# Patient Record
Sex: Male | Born: 1980 | Race: Black or African American | Hispanic: No | Marital: Single | Smoking: Never smoker
Health system: Southern US, Community
[De-identification: ages and names within clinical notes are randomized; demographics above are authoritative.]

---

## 2006-11-12 ENCOUNTER — Emergency Department: Payer: Self-pay | Admitting: Emergency Medicine

## 2007-08-15 ENCOUNTER — Emergency Department: Payer: Self-pay | Admitting: Emergency Medicine

## 2007-08-15 ENCOUNTER — Other Ambulatory Visit: Payer: Self-pay

## 2010-06-21 ENCOUNTER — Emergency Department: Payer: Self-pay | Admitting: Emergency Medicine

## 2011-04-24 ENCOUNTER — Emergency Department: Payer: Self-pay | Admitting: Emergency Medicine

## 2012-03-26 ENCOUNTER — Emergency Department: Payer: Self-pay | Admitting: Emergency Medicine

## 2013-06-24 IMAGING — CR RIGHT GREAT TOE
1 series · 3 of 3 positions shown · non-contrast
Comparison: none

REASON FOR EXAM: pain after basketball, eval for fx
COMMENTS:

[Series 1: ap · 0.17mm/px · 3 of 3 slices shown]
[im 1/3]
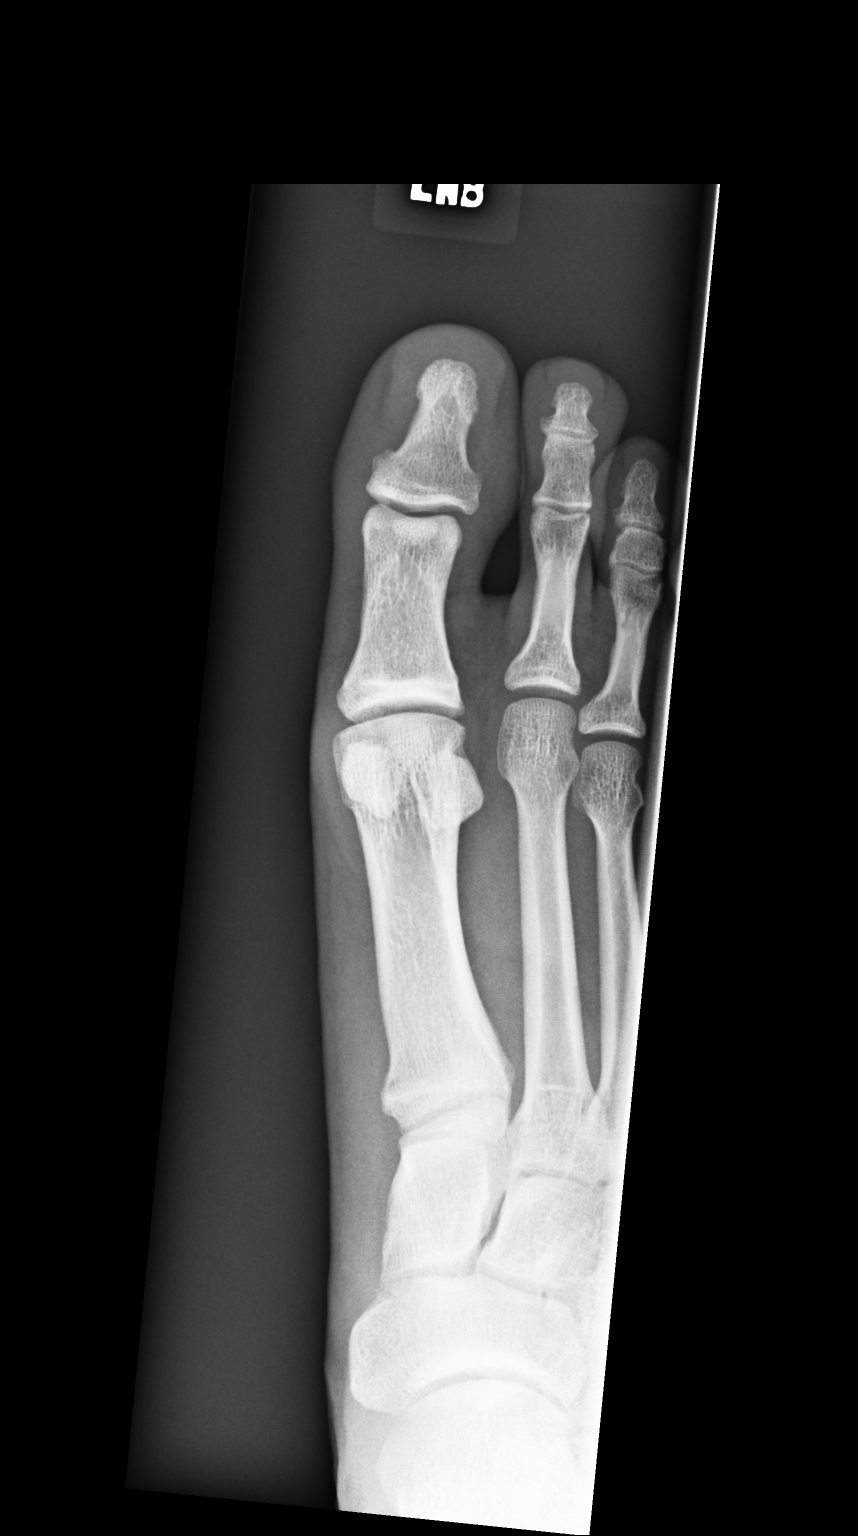
[im 2/3]
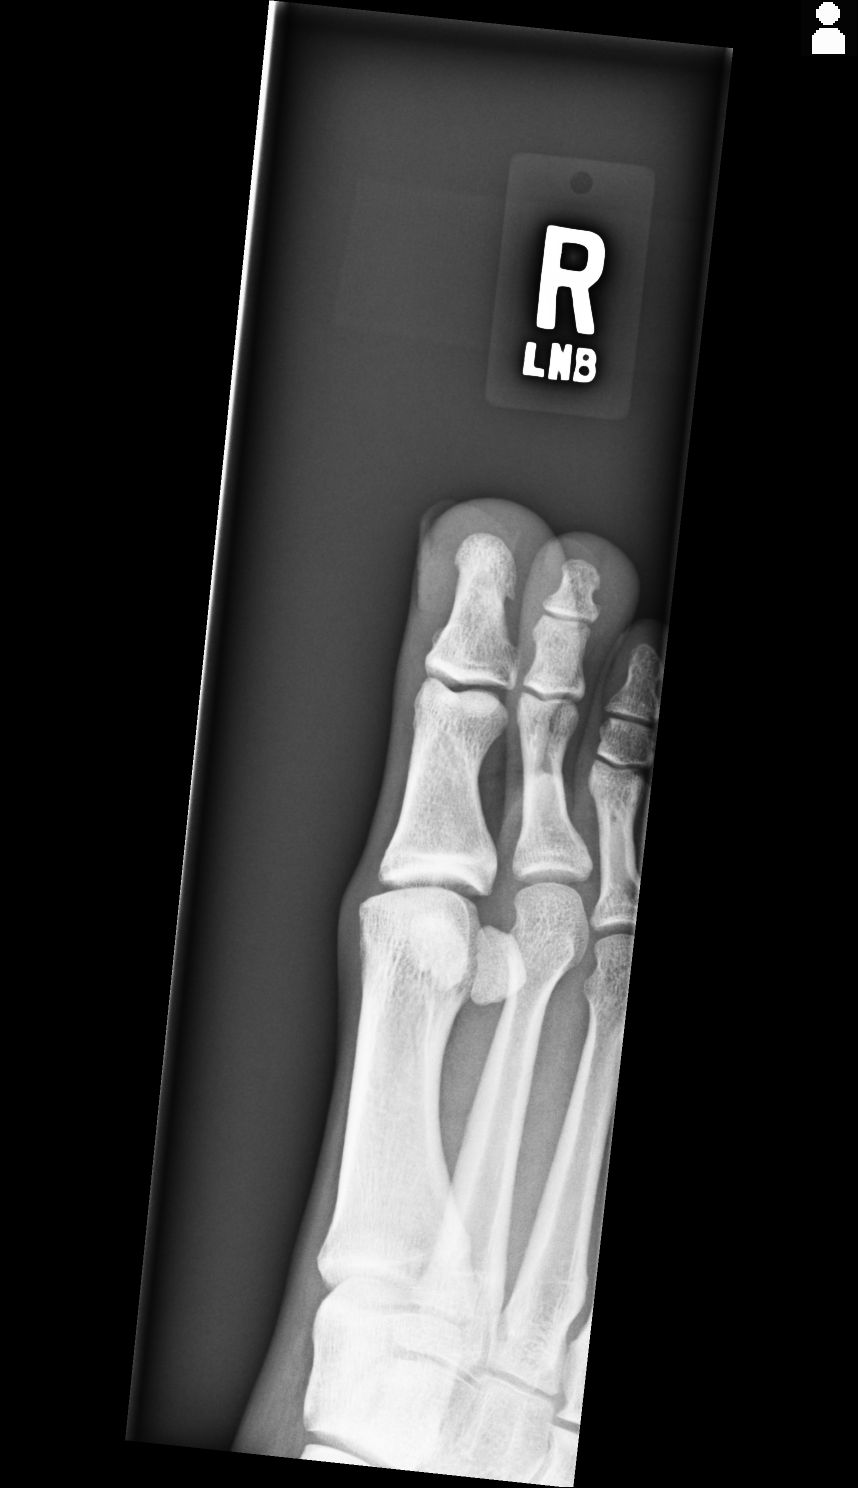
[im 3/3]
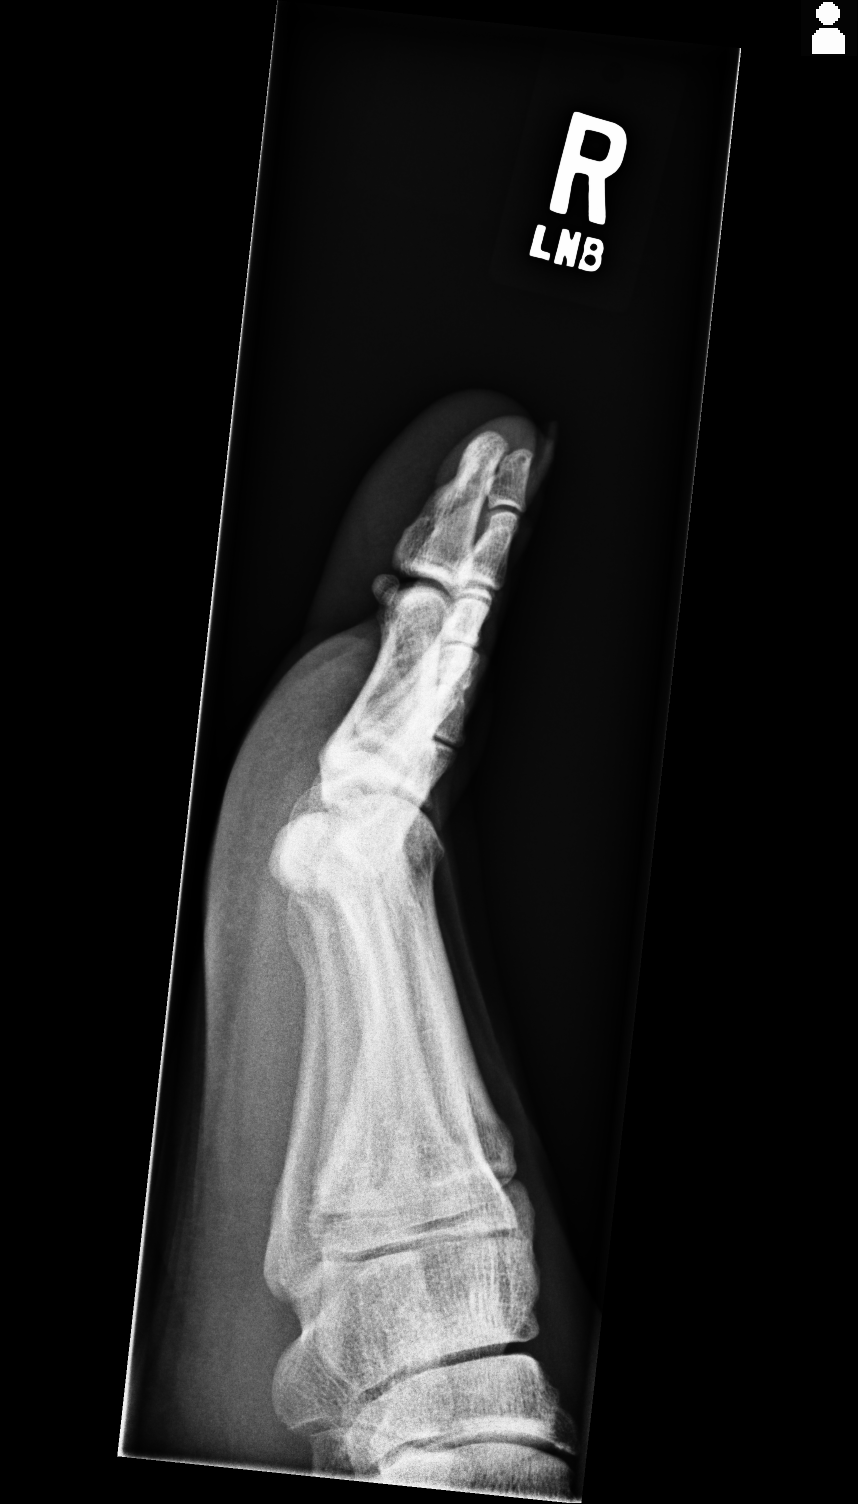

[3 of 3 positions shown; findings below may reference images not displayed]

PROCEDURE:     DXR - DXR TOE GREAT (1ST DIGIT) RT BONNELL  - March 26, 2012  [DATE]

RESULT:     Three views of the right great toe are reviewed. The bones are
adequately mineralized. There is no evidence of a lytic or blastic lesion.
There is no acute fracture demonstrated. The overlying soft tissues are
normal in appearance.
IMPRESSION: There is no acute bony abnormality of the right great toe.

[REDACTED]

## 2016-05-07 ENCOUNTER — Emergency Department: Payer: Self-pay

## 2016-05-07 ENCOUNTER — Emergency Department
Admission: EM | Admit: 2016-05-07 | Discharge: 2016-05-07 | Disposition: A | Discharge status: Home / Self Care | Payer: Self-pay | Attending: Emergency Medicine | Admitting: Emergency Medicine

## 2016-05-07 DIAGNOSIS — R52 Pain, unspecified: Secondary | ICD-10-CM

## 2016-05-07 DIAGNOSIS — M940 Chondrocostal junction syndrome [Tietze]: Secondary | ICD-10-CM | POA: Insufficient documentation

## 2016-05-07 MED ORDER — KETOROLAC TROMETHAMINE 30 MG/ML IJ SOLN
30.0000 mg | Freq: Once | INTRAMUSCULAR | Status: AC
  Administered 2016-05-07: 30 mg via INTRAMUSCULAR
  Filled 2016-05-07: qty 1

## 2016-05-07 MED ORDER — KETOROLAC TROMETHAMINE 30 MG/ML IJ SOLN: 30.0000 mg | Freq: Once | INTRAMUSCULAR | Status: DC

## 2016-05-07 MED ORDER — NAPROXEN 500 MG PO TBEC: 500.0000 mg | DELAYED_RELEASE_TABLET | Freq: Two times a day (BID) | ORAL | 0 refills | Status: AC

## 2016-05-07 NOTE — ED Provider Notes (Signed)
[facility] Emergency Department Provider Note  ____________________________________________  Time seen: Approximately 7:55 PM  I have reviewed the triage vital signs and the nursing notes.   HISTORY  Chief Complaint "chest wall pain"     HPI Philip Rivera is a 36 y.o. male presenting to the emergency department with reproducible right, sharp 6/10 costochondral pain, 9-10 th that has occurred for the past three days. Costochondral pain is worsened with deep inspiration.  Patient denies having cough. Triage note noted. Patient denies trauma to the anterior chest wall. 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 He denies congestion, rhinorrhea, pharyngitis, recreational drug use or recent illness. He has been afebrile. Patient denies a history of hypertension, cardiac pathology or anxiety. He has not attempted alleviating measures. Patient denies major adult illnesses. Patient takes no medications chronically.   History reviewed. No pertinent past medical history.  There are no active problems to display for this patient.   History reviewed. No pertinent surgical history.  Prior to Admission medications   Medication Sig Start Date End Date Taking? Authorizing Provider  naproxen (EC NAPROSYN) 500 MG EC tablet Take 1 tablet (500 mg total) by mouth 2 (two) times daily with a meal. 05/07/16 05/21/16  Orvil Feil, PA-C    Allergies Patient has no known allergies.  No family history on file.  Social History Social History  Substance Use Topics  . Smoking status: Not on file  . Smokeless tobacco: Never Used  . Alcohol use No     Review of Systems  Constitutional: No fever/chills ENT: No upper respiratory complaints. Cardiovascular: Patient has reproducible right, sharp 6/10 costochondral pain, 9-10th Respiratory: no cough. No SOB. Gastrointestinal: No abdominal pain.  No nausea, no vomiting.  No diarrhea.  No constipation. Skin: Negative  for rash, abrasions, lacerations, ecchymosis. Neurological: Negative for focal weakness or numbness. 10-point ROS otherwise negative.  ____________________________________________   PHYSICAL EXAM:  VITAL SIGNS: ED Triage Vitals  Enc Vitals Group     BP 05/07/16 1844 131/80     Pulse Rate 05/07/16 1844 83     Resp 05/07/16 1844 16     Temp 05/07/16 1844 98 F (36.7 C)     Temp Source 05/07/16 1844 Oral     SpO2 05/07/16 1844 100 %     Weight 05/07/16 1844 156 lb (70.8 kg)     Height 05/07/16 1844 5\' 11"  (1.803 m)     Head Circumference --      Peak Flow --      Pain Score 05/07/16 1846 7     Pain Loc --      Pain Edu? --      Excl. in GC? --     Constitutional: Alert and oriented. Patient is talkative and engaged.  Eyes: Palpebral and bulbar conjunctiva are nonerythematous bilaterally. PERRL. EOMI. No scleral icterus bilaterally. Head: Atraumatic. ENT:      Ears: Tympanic membranes are pearly bilaterally without effusion, erythema or purulent exudate. Bony landmarks are visualized bilaterally.       Nose: Skin overlying nares is without erythema. Nasal turbinates are non-erythematous. Nasal septum is midline.      Mouth/Throat: Mucous membranes are moist. Posterior pharynx is nonerythematous. No tonsillar exudate, hypertrophy or petechiae visualized. Uvula is midline. Neck: Full range of motion. No pain with neck flexion. Hematological/Lymphatic/Immunilogical: No cervical lymphadenopathy.  Cardiovascular: No scars of the skin overlying the anterior or posterior chest wall. Patient has reproducible right, costochondral pain, 9-10th.  Normal rate, regular rhythm. Normal S1 and S2. No murmurs, gallops or rubs auscultated.  Respiratory: Trachea is midline. No retractions or presence of deformity. Thoracic expansion is symmetric with unaccentuated tactile fremitus. Resonant and symmetric percussion tones bilaterally. On auscultation, adventitious sounds are absent.  Neurologic:   Normal for age. No gross focal neurologic deficits are appreciated.  Skin:  Skin is warm, dry and intact. No rash noted. No clubbing or cyanosis of the digits visualized.  Psychiatric: Mood and affect are normal for age. Speech and behavior are normal.   ____________________________________________   LABS (all labs ordered are listed, but only abnormal results are displayed)  Labs Reviewed - No data to display ____________________________________________  EKG   ____________________________________________  RADIOLOGY Geraldo PitterI, Jaclyn M Woods, personally viewed and evaluated these images (plain radiographs) as part of my medical decision making, as well as reviewing the written report by the radiologist.   Dg Ribs Unilateral W/chest Right  Result Date: 05/07/2016 CLINICAL DATA:  Right anterior rib pain, worsened with a deep breath. No known trauma. EXAM: RIGHT RIBS AND CHEST - 3+ VIEW COMPARISON:  None. FINDINGS: No pneumothorax. The heart, hila, and mediastinum are normal. No pulmonary nodules, masses, or infiltrates. No bony abnormalities are identified. Specifically, no fractures are seen within the right-sided ribs is on today's study. IMPRESSION: Negative. Electronically Signed   By: Gerome Samavid  Williams III M.D   On: 05/07/2016 20:24    ____________________________________________    PROCEDURES  Procedure(s) performed:    Procedures    Medications  ketorolac (TORADOL) 30 MG/ML injection 30 mg (30 mg Intramuscular Given 05/07/16 2015)     ____________________________________________   INITIAL IMPRESSION / ASSESSMENT AND PLAN / ED COURSE  Pertinent labs & imaging results that were available during my care of the patient were reviewed by me and considered in my medical decision making (see chart for details).  Review of the [place] CSRS was performed in accordance of the [facility] prior to dispensing any controlled drugs.  Clinical Course     Assessment and Plan:  Costochondritis   Patient presents to the emergency department with reproducible right, sharp 6/10 costochondral pain, 9-10 th. EKG performed in the emergency department reveals normal sinus rhythm without ST segment elevation, reviewed by Dr. Mayford KnifeWilliams. DG Ribs Unilateral W/Chest Right revealed no rib fractures, pneumothorax or pleural effusion. On physical exam, patient has reproducible anterior chest wall pain, consistent with costochondritis. Patient was discharged with naproxen. He was advised to follow-up with his primary care provider in 2 weeks. All patient questions were answered. Strict return precautions were given twice. Vital signs are reassuring at this time. ____________________________________________  FINAL CLINICAL IMPRESSION(S) / ED DIAGNOSES  Final diagnoses:  Costochondritis      NEW MEDICATIONS STARTED DURING THIS VISIT:  Discharge Medication List as of 05/07/2016  8:39 PM    START taking these medications   Details  naproxen (EC NAPROSYN) 500 MG EC tablet Take 1 tablet (500 mg total) by mouth 2 (two) times daily with a meal., Starting Sun 05/07/2016, Until Sun 05/21/2016, Print            This chart was dictated using voice recognition software/Dragon. Despite best efforts to proofread, errors can occur which can change the meaning. Any change was purely unintentional.    Orvil FeilJaclyn M Woods, PA-C 05/07/16 2301    Orvil FeilJaclyn M Woods, PA-C 05/07/16 [id number]    Minna AntisKevin Paduchowski, MD 05/08/16 2258

## 2016-05-07 NOTE — ED Triage Notes (Signed)
Nonproductive cough X 3 days, rib pain to right side with coughing. Pt alert and oriented X4, active, cooperative, pt in NAD. RR even and unlabored, color WNL.

## 2016-05-07 NOTE — ED Notes (Signed)
Denies cough or cold symptoms. Denies injury. States pain in right breast area when he breathes in.

## 2017-08-05 IMAGING — CR DG RIBS W/ CHEST 3+V*R*
3 series · 3 of 3 positions shown · non-contrast
Comparison: None.

CLINICAL DATA: Right anterior rib pain, worsened with a deep
breath. No known trauma.

EXAM:
RIGHT RIBS AND CHEST - 3+ VIEW

[chest pa]
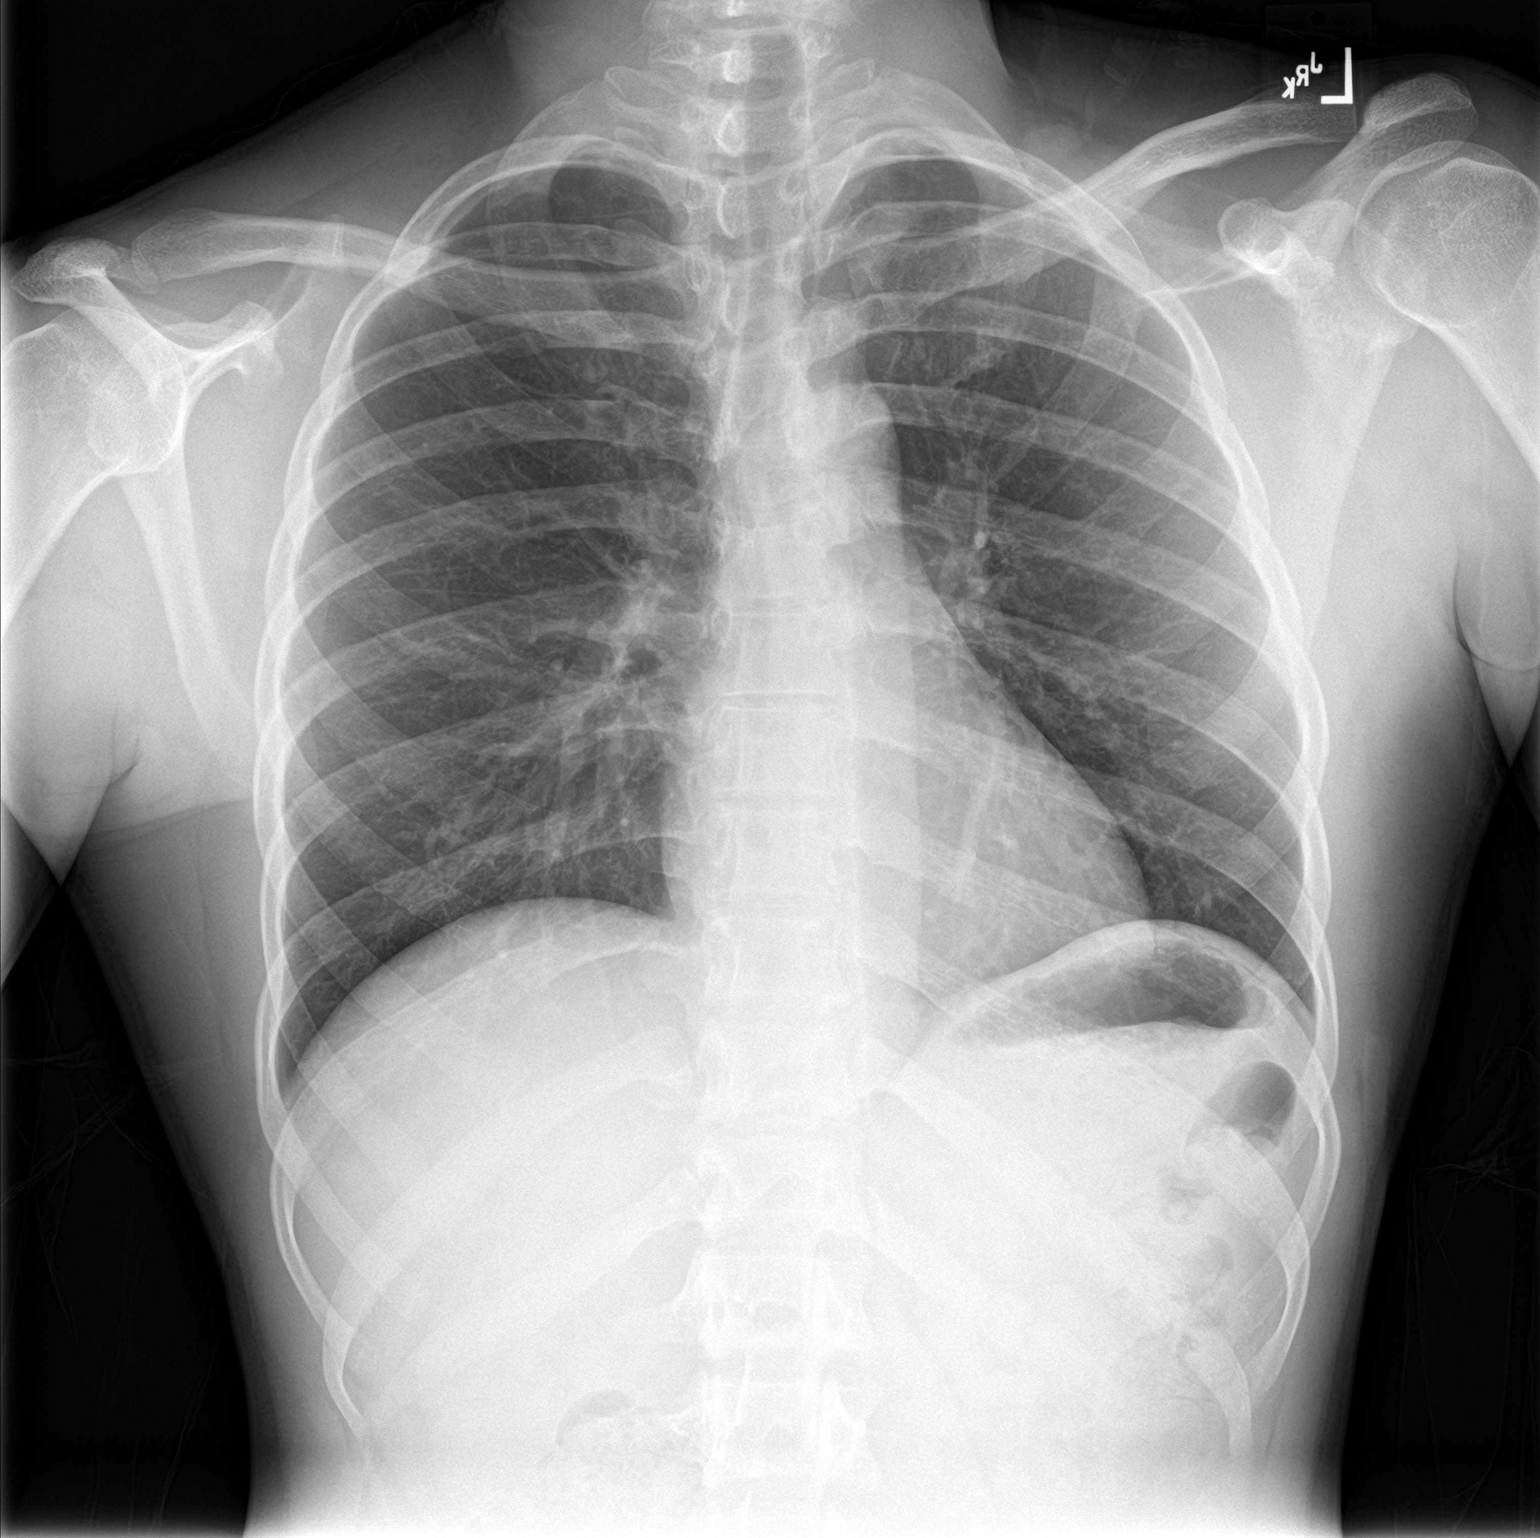

[rib pa]
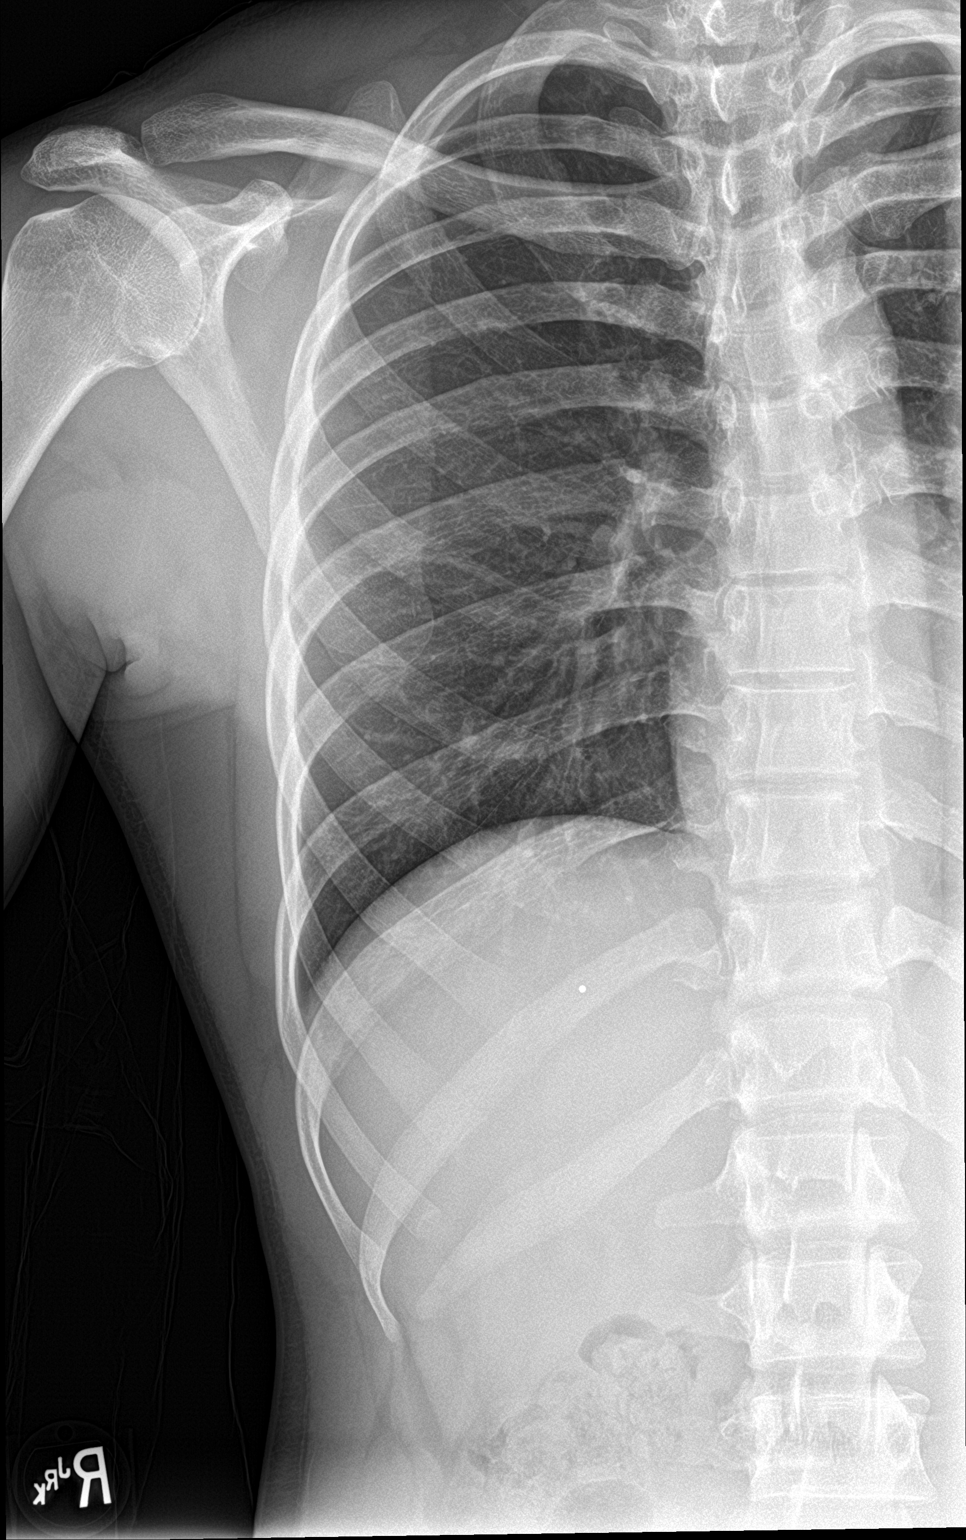

[rib pa obl]
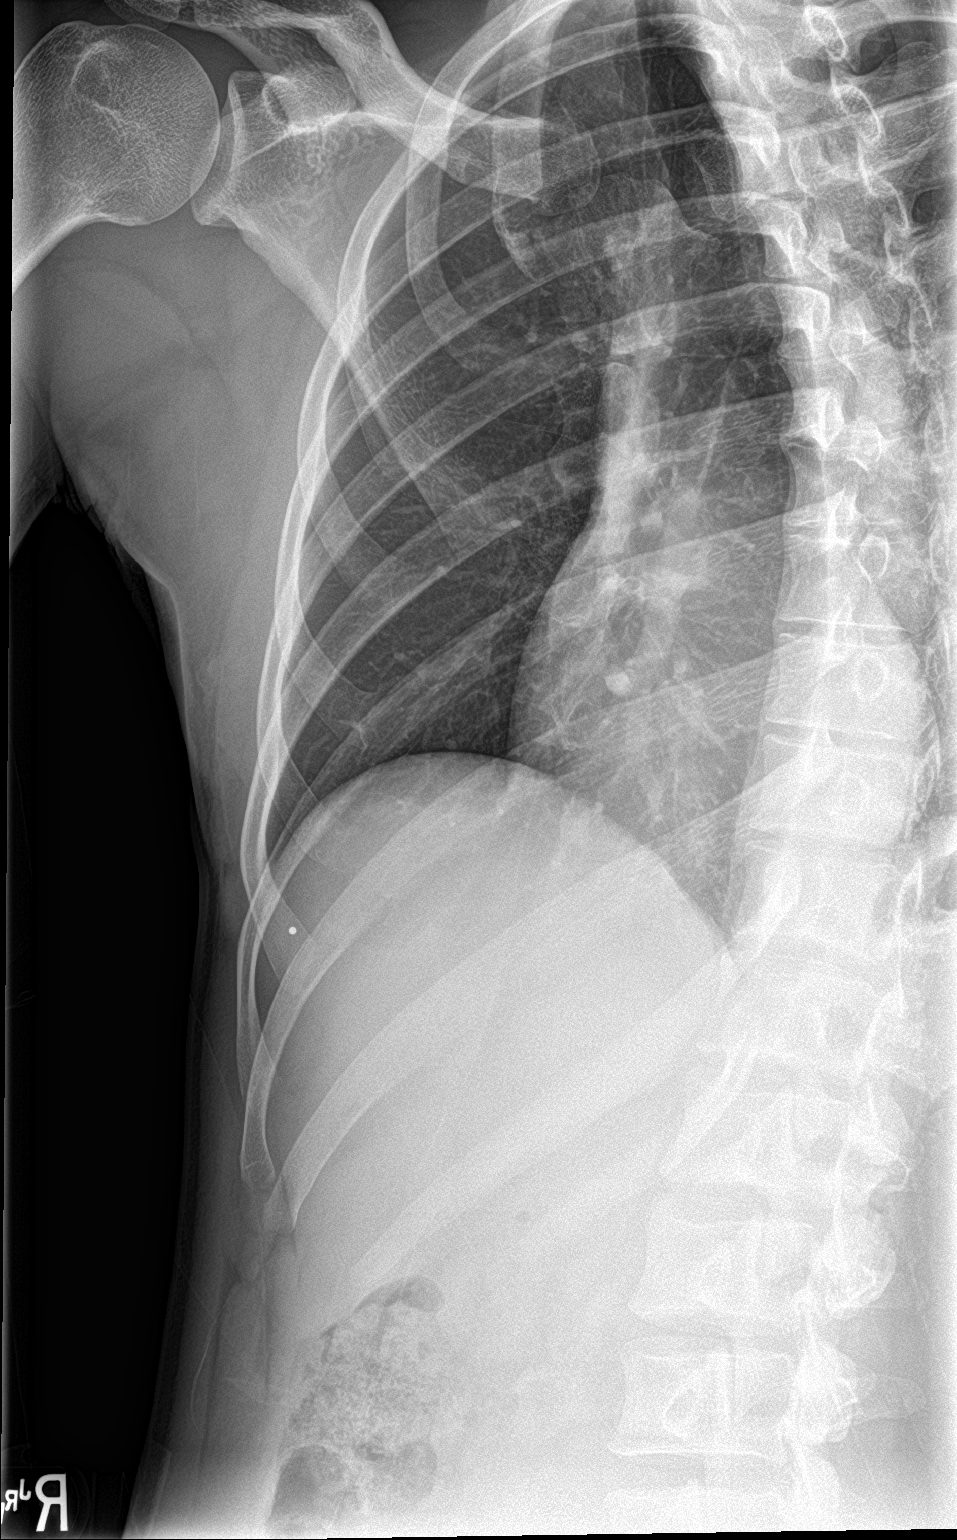

[3 of 3 positions shown; findings below may reference images not displayed]

FINDINGS: No pneumothorax. The heart, hila, and mediastinum are normal. No
pulmonary nodules, masses, or infiltrates. No bony abnormalities are
identified. Specifically, no fractures are seen within the
right-sided ribs is on today's study.
IMPRESSION: Negative.

## 2021-10-28 ENCOUNTER — Other Ambulatory Visit: Payer: Self-pay

## 2021-10-28 ENCOUNTER — Emergency Department
Admission: EM | Admit: 2021-10-28 | Discharge: 2021-10-28 | Disposition: A | Discharge status: Home / Self Care | Payer: Self-pay | Attending: Emergency Medicine | Admitting: Emergency Medicine

## 2021-10-28 DIAGNOSIS — S80862A Insect bite (nonvenomous), left lower leg, initial encounter: Secondary | ICD-10-CM | POA: Insufficient documentation

## 2021-10-28 DIAGNOSIS — W57XXXA Bitten or stung by nonvenomous insect and other nonvenomous arthropods, initial encounter: Secondary | ICD-10-CM | POA: Insufficient documentation

## 2021-10-28 MED ORDER — DOXYCYCLINE HYCLATE 100 MG PO CAPS: 100.0000 mg | ORAL_CAPSULE | Freq: Two times a day (BID) | ORAL | 0 refills | Status: AC

## 2021-10-28 NOTE — ED Triage Notes (Signed)
Pt states he removed a tick from his left LE about 2 weeks ago and wanting to make sure is doesn't have lime disease or something, states the area is slightly swollen and itches. Denies any fevers or other sx

## 2021-10-28 NOTE — Discharge Instructions (Signed)
Follow-up with your primary care provider if any continued problems.  You may also use Benadryl or cortisone cream to the area directly for any itching.  The test results for your tick bite can be seen on MyChart.  A prescription for doxycycline was sent to the pharmacy for you to take twice a day for the next 7 days.

## 2021-10-28 NOTE — ED Notes (Signed)
See triage note  Presents s/p tick bite to lower leg  states he noticed this about 2 weeks ago  denies any n/v/  fever body aches or h/a  afebrile on arrival

## 2021-10-28 NOTE — ED Provider Notes (Signed)
[facility] Provider Note    Event Date/Time   First MD Initiated Contact with Patient 10/28/21 0818     (approximate)   History   Insect Bite   HPI  Philip Rivera is a 41 y.o. male   presents to the ED with complaint of a tick bite to his left lower leg approximately 2 weeks ago.  Patient states that the area continues to itch and is slightly swollen.  He denies any fever, chills, headache or joint pain.  He denies any previous health problems.       Physical Exam   Triage Vital Signs: ED Triage Vitals  Enc Vitals Group     BP 10/28/21 0746 (!) 122/95     Pulse Rate 10/28/21 0746 69     Resp 10/28/21 0746 16     Temp 10/28/21 0746 98.2 F (36.8 C)     Temp Source 10/28/21 0746 Oral     SpO2 10/28/21 0746 99 %     Weight 10/28/21 0825 156 lb 1.4 oz (70.8 kg)     Height 10/28/21 0825 5\' 11"  (1.803 m)     Head Circumference --      Peak Flow --      Pain Score 10/28/21 0744 0     Pain Loc --      Pain Edu? --      Excl. in GC? --     Most recent vital signs: Vitals:   10/28/21 0746  BP: (!) 122/95  Pulse: 69  Resp: 16  Temp: 98.2 F (36.8 C)  SpO2: 99%     General: Awake, no distress.  CV:  Good peripheral perfusion.  Resp:  Normal effort.  Abd:  No distention.  Other:  Anterior left lower extremity mid tib-fib there is a erythematous lesion without vesicles or pustules.  Nontender to palpation.   ED Results / Procedures / Treatments   Labs (all labs ordered are listed, but only abnormal results are displayed) Labs Reviewed  ROCKY MTN SPOTTED FVR ABS PNL(IGG+IGM)  LYME DISEASE SEROLOGY W/REFLEX       PROCEDURES:  Critical Care performed:   Procedures   MEDICATIONS ORDERED IN ED: Medications - No data to display   IMPRESSION / MDM / ASSESSMENT AND PLAN / ED COURSE  I reviewed the triage vital signs and the nursing notes.   Differential diagnosis includes, but is not limited to, tick bite, [facility]  spotted fever, Lyme's disease.  41 year old male presents to the ED with complaint of tick bite from 2 weeks ago that is still visible on his left lower extremity.  Patient complains of itching.  He denies any fever, body aches, joint aches, headache.  Area is visible with discrete edges.  We discussed blood test and also starting on the doxycycline which is the drug of choice for [facility] spotted fever, Lyme's disease and [place] tick bites.  Patient is agreeable.  He will follow-up with his PCP or urgent care if any continued problems or concerns.  He is also aware that he can see the results of his blood test on MyChart.      Patient's presentation is most consistent with acute illness / injury with system symptoms.  FINAL CLINICAL IMPRESSION(S) / ED DIAGNOSES   Final diagnoses:  Tick bite of left lower leg, initial encounter     Rx / DC Orders   ED Discharge Orders  Ordered    doxycycline (VIBRAMYCIN) 100 MG capsule  2 times daily        10/28/21 5916             Note:  This document was prepared using Dragon voice recognition software and may include unintentional dictation errors.   Tommi Rumps, PA-C 10/28/21 0940    Minna Antis, MD 10/28/21 [id number]

## 2021-10-30 LAB — ROCKY MTN SPOTTED FVR ABS PNL(IGG+IGM)
RMSF IgG: NEGATIVE
RMSF IgM: 1.04 index — ABNORMAL HIGH (ref 0.00–0.89)

## 2021-10-31 ENCOUNTER — Telehealth: Payer: Self-pay | Admitting: Emergency Medicine

## 2021-10-31 LAB — LYME DISEASE SEROLOGY W/REFLEX: Lyme Total Antibody EIA: NEGATIVE

## 2021-10-31 NOTE — Telephone Encounter (Signed)
Called patient to inform of rmsf resutls equivocal and to assure he is completing his doxycycline.  Left message with my number if he has any questions and to follow up with pcp or return here if needed.
